# Patient Record
Sex: Male | Born: 1935 | Race: Black or African American | Hispanic: No | Marital: Single | Smoking: Former smoker
Health system: Southern US, Community
[De-identification: ages and names within clinical notes are randomized; demographics above are authoritative.]

---

## 1990-07-02 DIAGNOSIS — I10 Essential (primary) hypertension: Secondary | ICD-10-CM | POA: Insufficient documentation

## 2009-04-29 ENCOUNTER — Inpatient Hospital Stay: Payer: Self-pay | Admitting: Internal Medicine

## 2010-02-27 DIAGNOSIS — I6789 Other cerebrovascular disease: Secondary | ICD-10-CM | POA: Insufficient documentation

## 2010-11-07 IMAGING — CT CT ANGIOGRAPHY NECK
1 of 4 series · 12 of 33 positions shown · IV contrast (APPLIED)
Comparison: none

REASON FOR EXAM: abnormal carotid doppler
COMMENTS:

PROCEDURE:     CT  - CT ANGIOGRAPHY NECK W/WO  - May 02, 2009  [DATE]
RESULT:     Comparisons: No comparison
INDICATION: Abnormal carotid Doppler dated 04/30/2009
TECHNIQUE: 100 ml of Isovue 370 were administered and the extracranial
carotid arteries bilaterally were scanned during the arterial phase from the
level of the superior portion of the frontal sinuses to the proximal neck.
These images were then transferred to the Siemens work station and were
subsequently reviewed utilizing 3-D reconstructions and MIP images.

[Series 4: soft tissue · axial · 0.39mm/px · z∈[+94,+370]mm · 12 of 110 slices shown]
[im 9/110  soft-tissue]
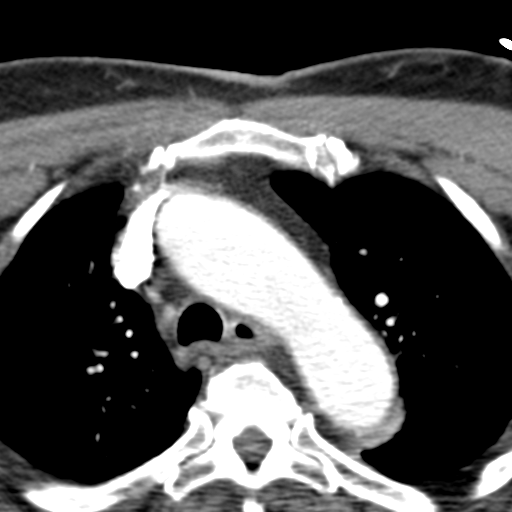
[im 17/110  bone]
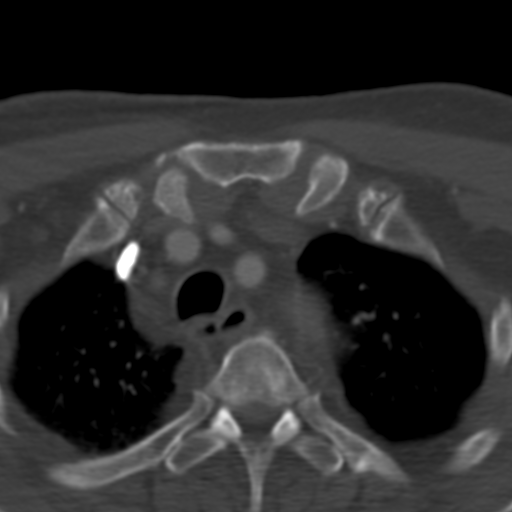
[im 26/110  soft-tissue]
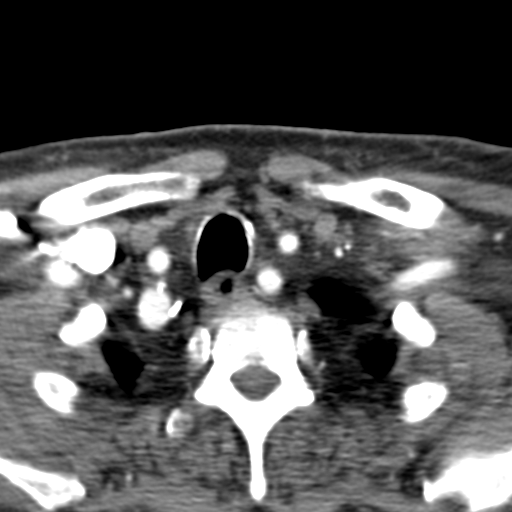
[im 34/110  bone]
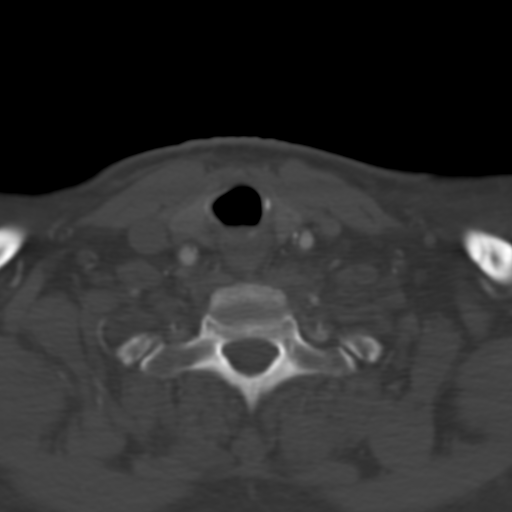
[im 42/110  soft-tissue]
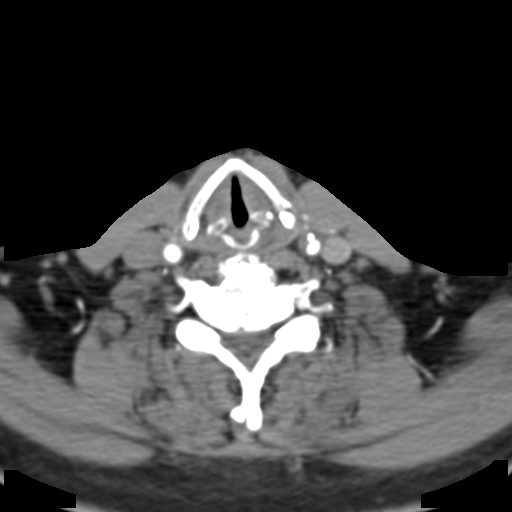
[im 51/110  bone]
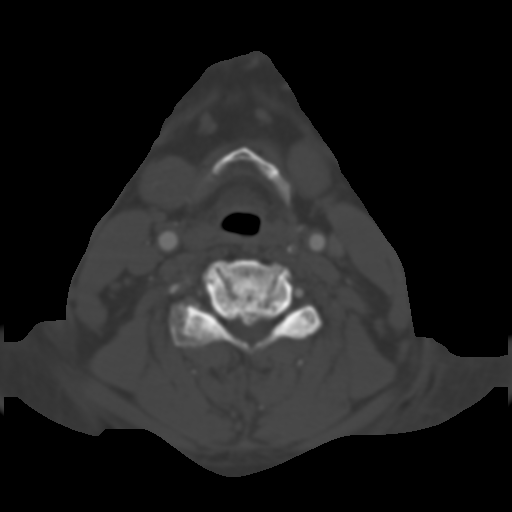
[im 59/110  soft-tissue]
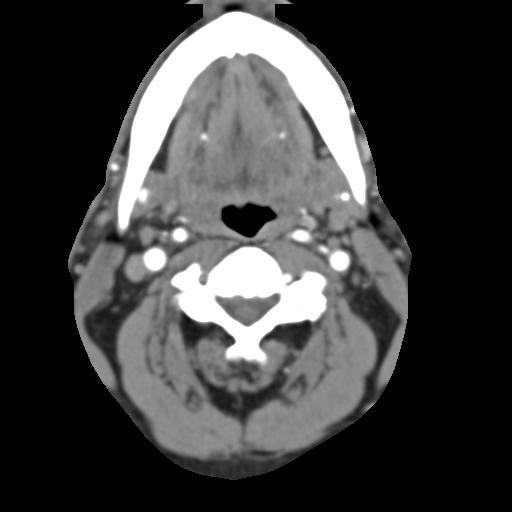
[im 68/110  bone]
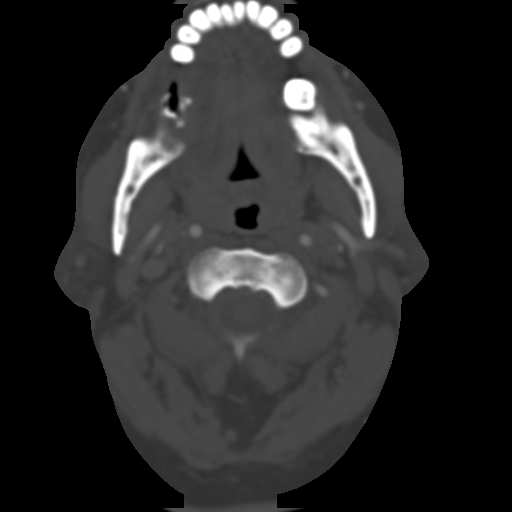
[im 76/110  soft-tissue]
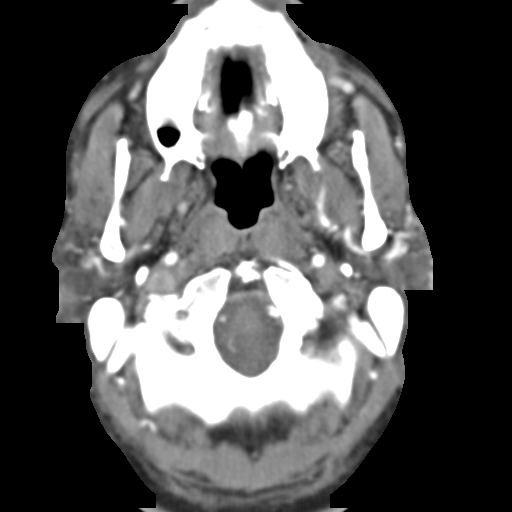
[im 84/110  bone]
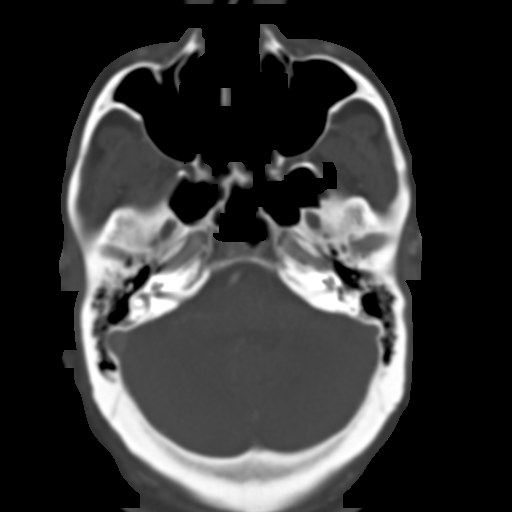
[im 93/110  soft-tissue]
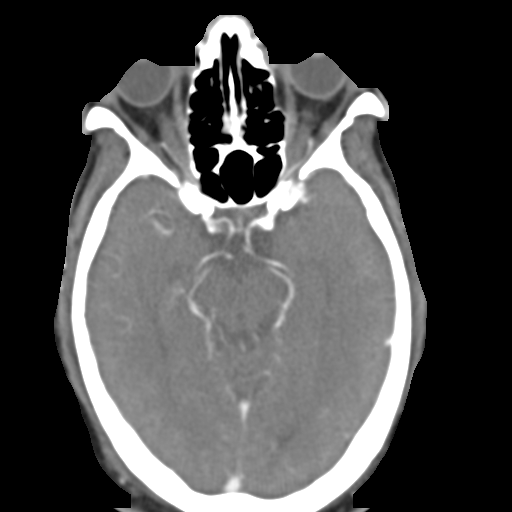
[im 101/110  bone]
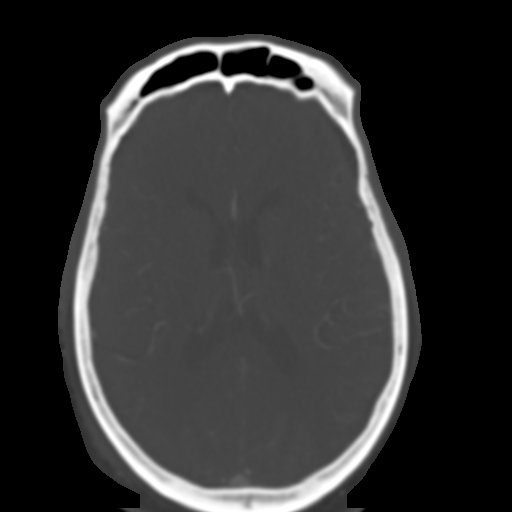

[12 of 33 positions shown; findings below may reference images not displayed]

FINDINGS: A three-vessel configuration of the aortic arch is identified with normal
ostium. The vertebral arteries are identified arising from the subclavian
arteries and entering the transverse foramen bilaterally at C6.

The carotid arteries are identified and are symmetric and unremarkable.
There is no evidence of aneurysm or carotid dissection bilaterally.

Right carotid artery: There is no significant atherosclerotic plaque within
the right common carotid artery. There is a complex, heavily calcified
atherosclerotic plaque within the right carotid bulb. There is approximately
50% stenosis of the right carotid bulb/proximal right internal carotid
artery, but evaluation slightly limited secondary to the blooming artifact
resulting from the dense calcification. The right external carotid artery is
patent.

Left carotid artery: There is complex atherosclerotic plaque within the left
common carotid artery with approximately 40% stenosis involving
approximately 5 cm of the mid left common carotid artery. There is a complex
atherosclerotic plaque within the left carotid bulb. There is less than 60 %
stenosis of the left carotid bulb/proximal right internal carotid artery,
but evaluation slightly limited secondary to the blooming artifact resulting
from the dense calcification. The left external carotid artery is patent.

The visualized portions of the brain are unremarkable.
IMPRESSION: 1. There is approximately 50% stenosis of the right carotid bulb/proximal
right internal carotid artery, but evaluation slightly limited secondary to
the blooming artifact resulting from the dense calcification.

2.  There is less than 60 % stenosis of the left carotid bulb/proximal right
internal carotid artery, but evaluation slightly limited secondary to the
blooming artifact resulting from the dense calcification.

3.  There is a complex atherosclerotic plaque within the left common carotid
artery with approximately 40% stenosis.

## 2011-11-21 DIAGNOSIS — E7800 Pure hypercholesterolemia, unspecified: Secondary | ICD-10-CM | POA: Insufficient documentation

## 2012-03-19 ENCOUNTER — Ambulatory Visit: Payer: Self-pay | Admitting: Ophthalmology

## 2012-03-31 ENCOUNTER — Ambulatory Visit: Payer: Self-pay | Admitting: Ophthalmology

## 2012-10-22 DIAGNOSIS — N189 Chronic kidney disease, unspecified: Secondary | ICD-10-CM | POA: Insufficient documentation

## 2012-10-22 DIAGNOSIS — G40909 Epilepsy, unspecified, not intractable, without status epilepticus: Secondary | ICD-10-CM | POA: Insufficient documentation

## 2012-10-22 DIAGNOSIS — I2510 Atherosclerotic heart disease of native coronary artery without angina pectoris: Secondary | ICD-10-CM | POA: Insufficient documentation

## 2013-03-18 ENCOUNTER — Ambulatory Visit: Payer: Self-pay | Admitting: Ophthalmology

## 2013-04-21 ENCOUNTER — Ambulatory Visit: Payer: Self-pay | Admitting: Ophthalmology

## 2014-05-27 DIAGNOSIS — I6529 Occlusion and stenosis of unspecified carotid artery: Secondary | ICD-10-CM | POA: Insufficient documentation

## 2015-02-08 DIAGNOSIS — M1A0610 Idiopathic chronic gout, right knee, without tophus (tophi): Secondary | ICD-10-CM | POA: Insufficient documentation

## 2015-03-13 NOTE — Op Note (Signed)
PATIENT NAME:  Spencer Gibson, [name] MR#:  [id number] DATE OF BIRTH:  [birth year]  DATE OF PROCEDURE:  03/31/2012  PREOPERATIVE DIAGNOSIS: Visually significant cataract of the left eye.   POSTOPERATIVE DIAGNOSIS: Visually significant cataract of the left eye.   OPERATIVE PROCEDURE: Cataract extraction by phacoemulsification with implant of intraocular lens to left eye.   SURGEON: Galen ManilaWilliam Cayde Held, MD.   ANESTHESIA:  1. Managed anesthesia care.  2. Topical tetracaine drops followed by 2% Xylocaine jelly applied in the preoperative holding area.   COMPLICATIONS: None.   TECHNIQUE:  Stop-and-chop    DESCRIPTION OF PROCEDURE: The patient was examined and consented in the preoperative holding area where the aforementioned topical anesthesia was applied to the left eye and then brought back to the Operating Room where the left eye was prepped and draped in the usual sterile ophthalmic fashion and a lid speculum was placed. A paracentesis was created with the side port blade and the anterior chamber was filled with viscoelastic. A near clear corneal incision was performed with the steel keratome. A continuous curvilinear capsulorrhexis was performed with a cystotome followed by the capsulorrhexis forceps. Hydrodissection and hydrodelineation were carried out with BSS on a blunt cannula. The lens was removed in a stop-and-chop technique and the remaining cortical material was removed with the irrigation-aspiration handpiece. The capsular bag was inflated with viscoelastic and the Technus ZCB00 19.0-diopter lens, serial number [id number] was placed in the capsular bag without complication. The remaining viscoelastic was removed from the eye with the irrigation-aspiration handpiece. The wounds were hydrated. The anterior chamber was flushed with Miostat and the eye was inflated to physiologic pressure. The wounds were found to be water tight. The eye was dressed with Vigamox. The patient was given protective  glasses to wear throughout the day and a shield with which to sleep tonight. The patient was also given drops with which to begin a drop regimen today and will follow-up with me in one day.   ____________________________ Jerilee FieldWilliam L. Elody Kleinsasser, MD wlp:drc D: 03/31/2012 10:47:00 ET T: 03/31/2012 11:08:31 ET JOB#: [id number]  cc: Jabir Dahlem L. Robbie Nangle, MD, <Dictator> Jerilee FieldWILLIAM L Katiejo Gilroy MD ELECTRONICALLY SIGNED 04/09/2012 13:37

## 2015-05-31 DIAGNOSIS — J069 Acute upper respiratory infection, unspecified: Secondary | ICD-10-CM | POA: Insufficient documentation

## 2016-12-24 ENCOUNTER — Encounter: Payer: Self-pay | Admitting: Podiatry

## 2016-12-24 ENCOUNTER — Ambulatory Visit (INDEPENDENT_AMBULATORY_CARE_PROVIDER_SITE_OTHER): Payer: Medicare Other | Admitting: Podiatry

## 2016-12-24 DIAGNOSIS — M2010 Hallux valgus (acquired), unspecified foot: Secondary | ICD-10-CM

## 2016-12-24 DIAGNOSIS — M79676 Pain in unspecified toe(s): Secondary | ICD-10-CM

## 2016-12-24 DIAGNOSIS — B351 Tinea unguium: Secondary | ICD-10-CM

## 2016-12-24 NOTE — Progress Notes (Signed)
   Subjective:    Patient ID: Spencer Gibson, male    DOB: 08/18/1934, 83 y.o.   MRN: [id number]  HPI this patient presents the office with chief complaint painful nails on both feet. He states the nails have become thick and long and he is unable to self treat. He also has severe bunions on both feet. He presents the office today for definitive evaluation and treatment of these long painful nails    Review of Systems  All other systems reviewed and are negative.      Objective:   Physical Exam  GENERAL APPEARANCE: Alert, conversant. Appropriately groomed. No acute distress.  VASCULAR: Pedal pulses are  palpable at  [facility] and PT bilateral.  Capillary refill time is immediate to all digits,  Normal temperature gradient.  Digital hair growth is present bilateral  NEUROLOGIC: sensation is normal to 5.07 monofilament at 5/5 sites bilateral.  Light touch is intact bilateral, Muscle strength normal.  MUSCULOSKELETAL: acceptable muscle strength, tone and stability bilateral.  Intrinsic muscluature intact bilateral.  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  No preulcerative lesions or ulcers  are seen, no interdigital maceration noted.  No open lesions present.  . No drainage noted.  NAILS  Thick disfigured discolored nails both feet        Assessment & Plan:  Onychomycosis  B/L  IE  Debride nails  B/L   Spencer Gibson DPM

## 2017-03-28 ENCOUNTER — Ambulatory Visit: Payer: Self-pay | Admitting: Podiatry

## 2017-12-23 ENCOUNTER — Ambulatory Visit (INDEPENDENT_AMBULATORY_CARE_PROVIDER_SITE_OTHER): Payer: 59 | Admitting: Podiatry

## 2017-12-23 ENCOUNTER — Encounter: Payer: Self-pay | Admitting: Podiatry

## 2017-12-23 DIAGNOSIS — M79676 Pain in unspecified toe(s): Secondary | ICD-10-CM

## 2017-12-23 DIAGNOSIS — B351 Tinea unguium: Secondary | ICD-10-CM

## 2017-12-23 DIAGNOSIS — D689 Coagulation defect, unspecified: Secondary | ICD-10-CM

## 2017-12-23 DIAGNOSIS — M2010 Hallux valgus (acquired), unspecified foot: Secondary | ICD-10-CM

## 2017-12-23 NOTE — Progress Notes (Signed)
Complaint:  Visit Type: Patient returns to my office for continued preventative foot care services. Complaint: Patient states" my nails have grown long and thick and become painful to walk and wear shoes" . The patient presents for preventative foot care services. No changes to ROS.  Patient has history of athletes foot between toes which he was given medicine by his medical doctor.  Podiatric Exam: Vascular: dorsalis pedis and posterior tibial pulses are palpable bilateral. Capillary return is immediate. Temperature gradient is WNL. Skin turgor WNL  Sensorium: Normal Semmes Weinstein monofilament test. Normal tactile sensation bilaterally. Nail Exam: Pt has thick disfigured discolored nails with subungual debris noted bilateral entire nail hallux through fifth toenails Ulcer Exam: There is no evidence of ulcer or pre-ulcerative changes or infection. Orthopedic Exam: Muscle tone and strength are WNL. No limitations in general ROM. No crepitus or effusions noted. HAV with HT B/L Skin: No Porokeratosis. No infection or ulcers.  Healing interdigital spaces except 4th right.  Diagnosis:  Onychomycosis, , Pain in right toe, pain in left toes  Treatment & Plan Procedures and Treatment: Consent by patient was obtained for treatment procedures.   Debridement of mycotic and hypertrophic toenails, 1 through 5 bilateral and clearing of subungual debris. No ulceration, no infection noted. Told to continue applying medicine. Return Visit-Office Procedure: Patient instructed to return to the office for a follow up visit 3 months for continued evaluation and treatment.    Helane GuntherGregory Levante Simones DPM

## 2018-03-24 ENCOUNTER — Encounter: Payer: Self-pay | Admitting: Podiatry

## 2018-03-24 ENCOUNTER — Ambulatory Visit (INDEPENDENT_AMBULATORY_CARE_PROVIDER_SITE_OTHER): Payer: 59 | Admitting: Podiatry

## 2018-03-24 DIAGNOSIS — D689 Coagulation defect, unspecified: Secondary | ICD-10-CM

## 2018-03-24 DIAGNOSIS — M79676 Pain in unspecified toe(s): Secondary | ICD-10-CM

## 2018-03-24 DIAGNOSIS — B351 Tinea unguium: Secondary | ICD-10-CM

## 2018-03-24 DIAGNOSIS — M2010 Hallux valgus (acquired), unspecified foot: Secondary | ICD-10-CM

## 2018-03-24 NOTE — Progress Notes (Signed)
Complaint:  Visit Type: Patient returns to my office for continued preventative foot care services. Complaint: Patient states" my nails have grown long and thick and become painful to walk and wear shoes" . The patient presents for preventative foot care services. No changes to ROS.  Patient is taking plavix.  Podiatric Exam: Vascular: dorsalis pedis and posterior tibial pulses are palpable bilateral. Capillary return is immediate. Temperature gradient is WNL. Skin turgor WNL  Sensorium: Normal Semmes Weinstein monofilament test. Normal tactile sensation bilaterally. Nail Exam: Pt has thick disfigured discolored nails with subungual debris noted bilateral entire nail hallux through fifth toenails Ulcer Exam: There is no evidence of ulcer or pre-ulcerative changes or infection. Orthopedic Exam: Muscle tone and strength are WNL. No limitations in general ROM. No crepitus or effusions noted. HAV with HT B/L Skin: No Porokeratosis. No infection or ulcers.  Healing interdigital spaces except 4th right.  Diagnosis:  Onychomycosis, , Pain in right toe, pain in left toes  Treatment & Plan Procedures and Treatment: Consent by patient was obtained for treatment procedures.   Debridement of mycotic and hypertrophic toenails, 1 through 5 bilateral and clearing of subungual debris. No ulceration, no infection noted.  Return Visit-Office Procedure: Patient instructed to return to the office for a follow up visit 3 months for continued evaluation and treatment.    [name] DPM

## 2018-06-26 ENCOUNTER — Encounter: Payer: Self-pay | Admitting: Podiatry

## 2018-06-26 ENCOUNTER — Ambulatory Visit (INDEPENDENT_AMBULATORY_CARE_PROVIDER_SITE_OTHER): Payer: 59 | Admitting: Podiatry

## 2018-06-26 DIAGNOSIS — B351 Tinea unguium: Secondary | ICD-10-CM

## 2018-06-26 DIAGNOSIS — M2010 Hallux valgus (acquired), unspecified foot: Secondary | ICD-10-CM | POA: Diagnosis not present

## 2018-06-26 DIAGNOSIS — M79676 Pain in unspecified toe(s): Secondary | ICD-10-CM | POA: Diagnosis not present

## 2018-06-26 DIAGNOSIS — D689 Coagulation defect, unspecified: Secondary | ICD-10-CM

## 2018-06-26 NOTE — Progress Notes (Signed)
Complaint:  Visit Type: Patient returns to my office for continued preventative foot care services. Complaint: Patient states" my nails have grown long and thick and become painful to walk and wear shoes" . The patient presents for preventative foot care services. No changes to ROS.  Patient is taking plavix.  Podiatric Exam: Vascular: dorsalis pedis and posterior tibial pulses are palpable bilateral. Capillary return is immediate. Temperature gradient is WNL. Skin turgor WNL  Sensorium: Normal Semmes Weinstein monofilament test. Normal tactile sensation bilaterally. Nail Exam: Pt has thick disfigured discolored nails with subungual debris noted bilateral entire nail hallux through fifth toenails Ulcer Exam: There is no evidence of ulcer or pre-ulcerative changes or infection. Orthopedic Exam: Muscle tone and strength are WNL. No limitations in general ROM. No crepitus or effusions noted. HAV with HT B/L Skin: No Porokeratosis. No infection or ulcers.    Diagnosis:  Onychomycosis, , Pain in right toe, pain in left toes  Treatment & Plan Procedures and Treatment: Consent by patient was obtained for treatment procedures.   Debridement of mycotic and hypertrophic toenails, 1 through 5 bilateral and clearing of subungual debris. No ulceration, no infection noted.  Return Visit-Office Procedure: Patient instructed to return to the office for a follow up visit 3 months for continued evaluation and treatment.    Reyanne Hussar DPM 

## 2018-09-29 ENCOUNTER — Ambulatory Visit: Payer: 59 | Admitting: Podiatry

## 2018-10-13 ENCOUNTER — Ambulatory Visit (INDEPENDENT_AMBULATORY_CARE_PROVIDER_SITE_OTHER): Payer: Medicare Other | Admitting: Podiatry

## 2018-10-13 ENCOUNTER — Encounter: Payer: Self-pay | Admitting: Podiatry

## 2018-10-13 DIAGNOSIS — M2010 Hallux valgus (acquired), unspecified foot: Secondary | ICD-10-CM

## 2018-10-13 DIAGNOSIS — M79676 Pain in unspecified toe(s): Secondary | ICD-10-CM | POA: Diagnosis not present

## 2018-10-13 DIAGNOSIS — B351 Tinea unguium: Secondary | ICD-10-CM

## 2018-10-13 DIAGNOSIS — D689 Coagulation defect, unspecified: Secondary | ICD-10-CM | POA: Diagnosis not present

## 2018-10-13 NOTE — Progress Notes (Signed)
Complaint:  Visit Type: Patient returns to my office for continued preventative foot care services. Complaint: Patient states" my nails have grown long and thick and become painful to walk and wear shoes" . The patient presents for preventative foot care services. No changes to ROS.  Patient is taking plavix.  Podiatric Exam: Vascular: dorsalis pedis and posterior tibial pulses are palpable bilateral. Capillary return is immediate. Temperature gradient is WNL. Skin turgor WNL  Sensorium: Normal Semmes Weinstein monofilament test. Normal tactile sensation bilaterally. Nail Exam: Pt has thick disfigured discolored nails with subungual debris noted bilateral entire nail hallux through fifth toenails Ulcer Exam: There is no evidence of ulcer or pre-ulcerative changes or infection. Orthopedic Exam: Muscle tone and strength are WNL. No limitations in general ROM. No crepitus or effusions noted. HAV with HT B/L Skin: No Porokeratosis. No infection or ulcers.    Diagnosis:  Onychomycosis, , Pain in right toe, pain in left toes  Treatment & Plan Procedures and Treatment: Consent by patient was obtained for treatment procedures.   Debridement of mycotic and hypertrophic toenails, 1 through 5 bilateral and clearing of subungual debris. No ulceration, no infection noted.  Return Visit-Office Procedure: Patient instructed to return to the office for a follow up visit 3 months for continued evaluation and treatment.    Helane GuntherGregory Pebbles Zeiders DPM

## 2019-01-12 ENCOUNTER — Encounter: Payer: Self-pay | Admitting: Podiatry

## 2019-01-12 ENCOUNTER — Ambulatory Visit (INDEPENDENT_AMBULATORY_CARE_PROVIDER_SITE_OTHER): Payer: Medicare Other | Admitting: Podiatry

## 2019-01-12 DIAGNOSIS — M79676 Pain in unspecified toe(s): Secondary | ICD-10-CM | POA: Diagnosis not present

## 2019-01-12 DIAGNOSIS — M2010 Hallux valgus (acquired), unspecified foot: Secondary | ICD-10-CM | POA: Diagnosis not present

## 2019-01-12 DIAGNOSIS — B351 Tinea unguium: Secondary | ICD-10-CM | POA: Diagnosis not present

## 2019-01-12 DIAGNOSIS — D689 Coagulation defect, unspecified: Secondary | ICD-10-CM | POA: Diagnosis not present

## 2019-01-12 NOTE — Progress Notes (Signed)
Complaint:  Visit Type: Patient returns to my office for continued preventative foot care services. Complaint: Patient states" my nails have grown long and thick and become painful to walk and wear shoes" . The patient presents for preventative foot care services. No changes to ROS.  Patient is taking plavix.  Podiatric Exam: Vascular: dorsalis pedis and posterior tibial pulses are palpable bilateral. Capillary return is immediate. Temperature gradient is WNL. Skin turgor WNL  Sensorium: Normal Semmes Weinstein monofilament test. Normal tactile sensation bilaterally. Nail Exam: Pt has thick disfigured discolored nails with subungual debris noted bilateral entire nail hallux through fifth toenails Ulcer Exam: There is no evidence of ulcer or pre-ulcerative changes or infection. Orthopedic Exam: Muscle tone and strength are WNL. No limitations in general ROM. No crepitus or effusions noted. HAV with HT B/L Skin: No Porokeratosis. No infection or ulcers.    Diagnosis:  Onychomycosis, , Pain in right toe, pain in left toes  Treatment & Plan Procedures and Treatment: Consent by patient was obtained for treatment procedures.   Debridement of mycotic and hypertrophic toenails, 1 through 5 bilateral and clearing of subungual debris. No ulceration, no infection noted.  Return Visit-Office Procedure: Patient instructed to return to the office for a follow up visit 10 weeks  for continued evaluation and treatment.    [name] DPM

## 2019-04-09 ENCOUNTER — Ambulatory Visit: Payer: Medicare Other | Admitting: Podiatry

## 2019-04-09 ENCOUNTER — Other Ambulatory Visit: Payer: Medicare Other

## 2019-04-09 ENCOUNTER — Other Ambulatory Visit: Payer: Self-pay

## 2019-04-09 ENCOUNTER — Ambulatory Visit (INDEPENDENT_AMBULATORY_CARE_PROVIDER_SITE_OTHER): Payer: Medicare Other | Admitting: Podiatry

## 2019-04-09 ENCOUNTER — Encounter: Payer: Self-pay | Admitting: Podiatry

## 2019-04-09 VITALS — Temp 96.6°F

## 2019-04-09 DIAGNOSIS — L309 Dermatitis, unspecified: Secondary | ICD-10-CM | POA: Diagnosis not present

## 2019-04-09 DIAGNOSIS — B351 Tinea unguium: Secondary | ICD-10-CM | POA: Diagnosis not present

## 2019-04-09 DIAGNOSIS — M79676 Pain in unspecified toe(s): Secondary | ICD-10-CM | POA: Diagnosis not present

## 2019-04-09 DIAGNOSIS — D689 Coagulation defect, unspecified: Secondary | ICD-10-CM | POA: Diagnosis not present

## 2019-04-09 DIAGNOSIS — M2010 Hallux valgus (acquired), unspecified foot: Secondary | ICD-10-CM

## 2019-04-09 NOTE — Progress Notes (Signed)
This patient presents to the office with chief complaint of a left swollen irritated left foot.  He presents to the office with a male which may be his daughter.  He says that this past Saturday his left foot became red swollen and painful and he was seen at [facility] for an evaluation and treatment.  I have no paperwork from his daughter and there is no information in the computer since [place]  is not affiliated with [facility].  He was diagnosed as having green  fungus toes and was treated with Lamisil and clindamycin.  He was told to soak his left foot in warm water and in clorox. Based on her explantion it sounded that he developed a cellulitis because of  interdigital masceration. The daughter said that his left foot became  Increasingly  Swollen and painful  and he had difficulty walking so she went to his medical doctor.Marland Kitchen He was prescribed hydrocodone.  She presents the office today stating that the swelling is down and the pain has lessened and there is no drainage from the top of the foot.  She is concerned today  about  the appearance on  the top of his left foot.  Overall she says she has improved but she is concerned with the top of his left foot.  My examination of the foot reveals a skin reactivity has occurred in the absence of any infection or streaking.  The toes have minimal swelling today.  During this visit he is also concerned might patient which I provide preventative foot care services.  He says his nails have grown long and thick and are painful walking and wearing his shoes.  General Appearance  Alert, conversant and in no acute stress.  Vascular  Dorsalis pedis and posterior tibial  pulses are weakly  palpable  bilaterally.  Capillary return is within normal limits  bilaterally. Temperature is within normal limits  Right foot.  Increased temperature left foot.  Swelling  B/L.  Neurologic  Senn-Weinstein monofilament wire test within normal limits  bilaterally. Muscle power within  normal limits bilaterally.  Nails Thick disfigured discolored nails with subungual debris  from hallux to fifth toes bilaterally. No evidence of bacterial infection or drainage bilaterally.  Orthopedic  No limitations of motion  feet .  No crepitus or effusions noted.  No bony pathology or digital deformities noted.  Skin  normotropic skin with no porokeratosis right foot.  No evidence of streaking or drainage.  There is hot swollen red/purplish discoloration on the dorsum of left foot.  See pictures.        Onychomycosis  B/L  Dermatitis left foot.  ROV.  Debridement of his nails x10.  Examined his left foot and noted that there is red discoloration noted through dark areas of skin on the dorsum of the left foot.  There is no drainage today  and no evidence of any open wounds on the dorsum of the left foot.  There is no evidence of any streaking and no severe pain on the dorsum of the foot.  I feel based on my evaluation that he has some type of skin reactivity.  This could include reaction to the medication or reaction to the Clorox or even hot water which could have been utilized to soak his left foot.  After discussing this condition with this patient I told them to continue the clindamycin and discontinue the Lamisil.  Told him to stop soaking the left foot in Clorox and water.  Patient was told to soak his left foot and soapy sudsy water.  Patient was told to monitor his foot and to return to the office if the foot worsens.  RTC 3 months for preventative foot care services.  Helane GuntherGregory Madalyne Husk DPM

## 2019-04-10 MED ORDER — SILVER SULFADIAZINE 1 % EX CREA: 1.0000 "application " | TOPICAL_CREAM | Freq: Every day | CUTANEOUS | 1 refills | Status: DC

## 2019-04-10 NOTE — Addendum Note (Signed)
Addended by: Geraldine Contras D on: 04/10/2019 01:46 PM   Modules accepted: Orders

## 2019-04-20 ENCOUNTER — Other Ambulatory Visit: Payer: Medicare Other

## 2019-04-23 ENCOUNTER — Encounter: Payer: Self-pay | Admitting: Podiatry

## 2019-04-23 ENCOUNTER — Ambulatory Visit: Payer: Medicare Other | Admitting: Podiatry

## 2019-04-23 ENCOUNTER — Ambulatory Visit (INDEPENDENT_AMBULATORY_CARE_PROVIDER_SITE_OTHER): Payer: Medicare Other | Admitting: Podiatry

## 2019-04-23 ENCOUNTER — Other Ambulatory Visit: Payer: Self-pay

## 2019-04-23 DIAGNOSIS — L309 Dermatitis, unspecified: Secondary | ICD-10-CM

## 2019-04-23 DIAGNOSIS — B351 Tinea unguium: Secondary | ICD-10-CM

## 2019-04-23 DIAGNOSIS — M79676 Pain in unspecified toe(s): Secondary | ICD-10-CM | POA: Diagnosis not present

## 2019-04-23 DIAGNOSIS — D689 Coagulation defect, unspecified: Secondary | ICD-10-CM
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
## 2019-05-14 ENCOUNTER — Ambulatory Visit: Payer: Medicare Other | Admitting: Podiatry

## 2019-07-09 ENCOUNTER — Ambulatory Visit: Payer: Medicare Other | Admitting: Podiatry

## 2019-07-23 ENCOUNTER — Ambulatory Visit: Payer: Medicare Other | Admitting: Podiatry

## 2019-07-24 DIAGNOSIS — K5909 Other constipation: Secondary | ICD-10-CM | POA: Insufficient documentation

## 2019-10-13 DIAGNOSIS — K353 Acute appendicitis with localized peritonitis, without perforation or gangrene: Secondary | ICD-10-CM | POA: Insufficient documentation

## 2020-01-04 ENCOUNTER — Other Ambulatory Visit: Payer: Self-pay

## 2020-01-04 ENCOUNTER — Encounter: Payer: Self-pay | Admitting: Podiatry

## 2020-01-04 ENCOUNTER — Ambulatory Visit (INDEPENDENT_AMBULATORY_CARE_PROVIDER_SITE_OTHER): Payer: Medicare Other | Admitting: Podiatry

## 2020-01-04 DIAGNOSIS — D689 Coagulation defect, unspecified: Secondary | ICD-10-CM | POA: Diagnosis not present

## 2020-01-04 DIAGNOSIS — M79676 Pain in unspecified toe(s): Secondary | ICD-10-CM

## 2020-01-04 DIAGNOSIS — B351 Tinea unguium: Secondary | ICD-10-CM

## 2020-01-04 NOTE — Progress Notes (Signed)
Complaint:  Visit Type: Patient returns to my office for continued preventative foot care services. Complaint: Patient states" my nails have grown long and thick and become painful to walk and wear shoes" Patient has been taking plavix.. The patient presents for preventative foot care services.  Podiatric Exam: Vascular: dorsalis pedis and posterior tibial pulses are weakly  palpable bilateral. Capillary return is immediate. Temperature gradient is WNL. Skin turgor WNL  Sensorium: Normal Semmes Weinstein monofilament test. Normal tactile sensation bilaterally. Nail Exam: Pt has thick disfigured discolored nails with subungual debris noted bilateral entire nail hallux through fifth toenails Ulcer Exam: There is no evidence of ulcer or pre-ulcerative changes or infection. Orthopedic Exam: Muscle tone and strength are WNL. No limitations in general ROM. No crepitus or effusions noted. Foot type and digits show no abnormalities. Bony prominences are unremarkable. Skin: No Porokeratosis. No infection or ulcers  Diagnosis:  Onychomycosis, , Pain in right toe, pain in left toes  Treatment & Plan Procedures and Treatment: Consent by patient was obtained for treatment procedures.   Debridement of mycotic and hypertrophic toenails, 1 through 5 bilateral and clearing of subungual debris. No ulceration, no infection noted.  Return Visit-Office Procedure: Patient instructed to return to the office for a follow up visit 3 months for continued evaluation and treatment.    [name] DPM

## 2020-02-05 DIAGNOSIS — N400 Enlarged prostate without lower urinary tract symptoms: Secondary | ICD-10-CM | POA: Insufficient documentation

## 2020-02-15 DIAGNOSIS — K56609 Unspecified intestinal obstruction, unspecified as to partial versus complete obstruction: Secondary | ICD-10-CM | POA: Insufficient documentation

## 2020-02-19 DIAGNOSIS — E663 Overweight: Secondary | ICD-10-CM | POA: Insufficient documentation

## 2020-03-07 DIAGNOSIS — R339 Retention of urine, unspecified: Secondary | ICD-10-CM | POA: Insufficient documentation

## 2020-04-07 ENCOUNTER — Ambulatory Visit (INDEPENDENT_AMBULATORY_CARE_PROVIDER_SITE_OTHER): Payer: Medicare Other | Admitting: Podiatry

## 2020-04-07 ENCOUNTER — Encounter: Payer: Self-pay | Admitting: Podiatry

## 2020-04-07 ENCOUNTER — Other Ambulatory Visit: Payer: Self-pay

## 2020-04-07 DIAGNOSIS — B351 Tinea unguium: Secondary | ICD-10-CM | POA: Diagnosis not present

## 2020-04-07 DIAGNOSIS — M79676 Pain in unspecified toe(s): Secondary | ICD-10-CM | POA: Diagnosis not present

## 2020-04-07 DIAGNOSIS — D689 Coagulation defect, unspecified: Secondary | ICD-10-CM

## 2020-04-07 DIAGNOSIS — N189 Chronic kidney disease, unspecified: Secondary | ICD-10-CM

## 2020-04-07 NOTE — Progress Notes (Signed)
This patient returns to my office for at risk foot care.  This patient requires this care by a professional since this patient will be at risk due to having coagulation defect and chronic kidney disease.  Patient is taking plavix.  This patient is unable to cut nails himself since the patient cannot reach his nails.These nails are painful walking and wearing shoes.  This patient presents for at risk foot care today.  General Appearance  Alert, conversant and in no acute stress.  Vascular  Dorsalis pedis and posterior tibial  pulses are weakly  palpable  bilaterally.  Capillary return is within normal limits  bilaterally. Temperature is within normal limits  bilaterally.  Neurologic  Senn-Weinstein monofilament wire test within normal limits  bilaterally. Muscle power within normal limits bilaterally.  Nails Thick disfigured discolored nails with subungual debris  from hallux to fifth toes bilaterally. No evidence of bacterial infection or drainage bilaterally.  Orthopedic  No limitations of motion  feet .  No crepitus or effusions noted.  No bony pathology or digital deformities noted.  Skin  normotropic skin with no porokeratosis noted bilaterally.  No signs of infections or ulcers noted.     Onychomycosis  Pain in right toes  Pain in left toes  Consent was obtained for treatment procedures.   Mechanical debridement of nails 1-5  bilaterally performed with a nail nipper.  Filed with dremel without incident.    Return office visit    3 months                  Told patient to return for periodic foot care and evaluation due to potential at risk complications.   Kizzy Olafson DPM  

## 2020-07-11 ENCOUNTER — Ambulatory Visit: Payer: Medicare Other | Admitting: Podiatry

## 2020-08-01 ENCOUNTER — Ambulatory Visit (INDEPENDENT_AMBULATORY_CARE_PROVIDER_SITE_OTHER): Payer: Medicare Other | Admitting: Podiatry

## 2020-08-01 ENCOUNTER — Other Ambulatory Visit: Payer: Self-pay

## 2020-08-01 ENCOUNTER — Encounter: Payer: Self-pay | Admitting: Podiatry

## 2020-08-01 DIAGNOSIS — M79676 Pain in unspecified toe(s): Secondary | ICD-10-CM | POA: Diagnosis not present

## 2020-08-01 DIAGNOSIS — B351 Tinea unguium: Secondary | ICD-10-CM

## 2020-08-01 DIAGNOSIS — N189 Chronic kidney disease, unspecified: Secondary | ICD-10-CM

## 2020-08-01 DIAGNOSIS — M109 Gout, unspecified: Secondary | ICD-10-CM | POA: Insufficient documentation

## 2020-08-01 DIAGNOSIS — D689 Coagulation defect, unspecified: Secondary | ICD-10-CM

## 2020-08-01 NOTE — Progress Notes (Signed)
This patient returns to my office for at risk foot care.  This patient requires this care by a professional since this patient will be at risk due to having coagulation defect and chronic kidney disease.  Patient is taking plavix.  This patient is unable to cut nails himself since the patient cannot reach his nails.These nails are painful walking and wearing shoes.  This patient presents for at risk foot care today.  General Appearance  Alert, conversant and in no acute stress.  Vascular  Dorsalis pedis and posterior tibial  pulses are weakly  palpable  bilaterally.  Capillary return is within normal limits  bilaterally. Temperature is within normal limits  bilaterally.  Neurologic  Senn-Weinstein monofilament wire test within normal limits  bilaterally. Muscle power within normal limits bilaterally.  Nails Thick disfigured discolored nails with subungual debris  from hallux to fifth toes bilaterally. No evidence of bacterial infection or drainage bilaterally.  Orthopedic  No limitations of motion  feet .  No crepitus or effusions noted.  No bony pathology or digital deformities noted.  Skin  normotropic skin with no porokeratosis noted bilaterally.  No signs of infections or ulcers noted.     Onychomycosis  Pain in right toes  Pain in left toes  Consent was obtained for treatment procedures.   Mechanical debridement of nails 1-5  bilaterally performed with a nail nipper.  Filed with dremel without incident.    Return office visit    3 months                  Told patient to return for periodic foot care and evaluation due to potential at risk complications.   [name] DPM

## 2020-08-03 ENCOUNTER — Other Ambulatory Visit: Payer: Self-pay

## 2020-08-03 ENCOUNTER — Ambulatory Visit: Payer: Medicare Other | Attending: Internal Medicine

## 2020-08-03 DIAGNOSIS — Z23 Encounter for immunization: Secondary | ICD-10-CM

## 2020-08-03 NOTE — Progress Notes (Signed)
   Covid-19 Vaccination Clinic  Name:  Spencer Gibson    MRN: [id number] DOB: 07-Oct-1934  08/03/2020  [name] was observed post Covid-19 immunization for 15 minutes without incident. He was provided with Vaccine Information Sheet and instruction to access the V-Safe system.   [name] was instructed to call 911 with any severe reactions post vaccine: Marland Kitchen Difficulty breathing  . Swelling of face and throat  . A fast heartbeat  . A bad rash all over body  . Dizziness and weakness   Immunizations Administered    Name Date Dose VIS Date Route   Pfizer COVID-19 Vaccine 08/03/2020 12:50 PM 0.3 mL 01/13/2019 Intramuscular   Manufacturer: [employer], [employer]   Lot: [id number]   NDC: [address]-0

## 2020-08-23 ENCOUNTER — Other Ambulatory Visit: Payer: Self-pay

## 2020-08-23 ENCOUNTER — Ambulatory Visit: Payer: Medicare Other | Attending: Internal Medicine

## 2020-08-23 DIAGNOSIS — Z23 Encounter for immunization: Secondary | ICD-10-CM

## 2020-08-23 NOTE — Progress Notes (Signed)
° °  Covid-19 Vaccination Clinic  Name:  Spencer Gibson    MRN: [id number] DOB: 1933-12-22  08/23/2020  Mr. Spencer Gibson was observed post Covid-19 immunization for 15 minutes without incident. He was provided with Vaccine Information Sheet and instruction to access the V-Safe system.   Mr. Spencer Gibson was instructed to call 911 with any severe reactions post vaccine:  Difficulty breathing   Swelling of face and throat   A fast heartbeat   A bad rash all over body   Dizziness and weakness   Immunizations Administered    Name Date Dose VIS Date Route   Pfizer COVID-19 Vaccine 08/23/2020 11:05 AM 0.3 mL 01/13/2019 Intramuscular   Manufacturer: [employer]   Lot: [id number]   NDC: [id number]

## 2020-10-31 ENCOUNTER — Ambulatory Visit: Payer: Medicare Other | Admitting: Podiatry

## 2020-11-07 ENCOUNTER — Other Ambulatory Visit: Payer: Self-pay

## 2020-11-07 ENCOUNTER — Encounter: Payer: Self-pay | Admitting: Podiatry

## 2020-11-07 ENCOUNTER — Ambulatory Visit (INDEPENDENT_AMBULATORY_CARE_PROVIDER_SITE_OTHER): Payer: Medicare Other | Admitting: Podiatry

## 2020-11-07 DIAGNOSIS — N189 Chronic kidney disease, unspecified: Secondary | ICD-10-CM

## 2020-11-07 DIAGNOSIS — D689 Coagulation defect, unspecified: Secondary | ICD-10-CM

## 2020-11-07 DIAGNOSIS — L309 Dermatitis, unspecified: Secondary | ICD-10-CM

## 2020-11-07 DIAGNOSIS — B351 Tinea unguium: Secondary | ICD-10-CM

## 2020-11-07 DIAGNOSIS — M79676 Pain in unspecified toe(s): Secondary | ICD-10-CM

## 2020-11-07 NOTE — Progress Notes (Signed)
This patient returns to my office for at risk foot care.  This patient requires this care by a professional since this patient will be at risk due to having coagulation defect and chronic kidney disease.  Patient is taking plavix.  This patient is unable to cut nails himself since the patient cannot reach his nails.These nails are painful walking and wearing shoes.  This patient presents for at risk foot care today.  General Appearance  Alert, conversant and in no acute stress.  Vascular  Dorsalis pedis and posterior tibial  pulses are weakly  palpable  bilaterally.  Capillary return is within normal limits  bilaterally. Temperature is within normal limits  bilaterally.  Neurologic  Senn-Weinstein monofilament wire test within normal limits  bilaterally. Muscle power within normal limits bilaterally.  Nails Thick disfigured discolored nails with subungual debris  from hallux to fifth toes bilaterally. No evidence of bacterial infection or drainage bilaterally.  Orthopedic  No limitations of motion  feet .  No crepitus or effusions noted.  No bony pathology or digital deformities noted.  Skin  normotropic skin with no porokeratosis noted bilaterally.  No signs of infections or ulcers noted.  Masceration interdigitally  Both feet.   Onychomycosis  Pain in right toes  Pain in left toes  Consent was obtained for treatment procedures.   Mechanical debridement of nails 1-5  bilaterally performed with a nail nipper.  Filed with dremel without incident.  Patient was told to apply powder between toes both feet.   Return office visit    3 months                  Told patient to return for periodic foot care and evaluation due to potential at risk complications.   Karyna Bessler DPM  

## 2021-01-25 ENCOUNTER — Ambulatory Visit: Payer: Medicare Other

## 2021-02-13 ENCOUNTER — Other Ambulatory Visit: Payer: Self-pay

## 2021-02-13 ENCOUNTER — Ambulatory Visit (INDEPENDENT_AMBULATORY_CARE_PROVIDER_SITE_OTHER): Payer: Medicare Other | Admitting: Podiatry

## 2021-02-13 ENCOUNTER — Encounter: Payer: Self-pay | Admitting: Podiatry

## 2021-02-13 DIAGNOSIS — M79676 Pain in unspecified toe(s): Secondary | ICD-10-CM

## 2021-02-13 DIAGNOSIS — M2010 Hallux valgus (acquired), unspecified foot: Secondary | ICD-10-CM

## 2021-02-13 DIAGNOSIS — N189 Chronic kidney disease, unspecified: Secondary | ICD-10-CM

## 2021-02-13 DIAGNOSIS — D689 Coagulation defect, unspecified: Secondary | ICD-10-CM

## 2021-02-13 DIAGNOSIS — B351 Tinea unguium: Secondary | ICD-10-CM

## 2021-02-13 NOTE — Progress Notes (Signed)
This patient returns to my office for at risk foot care.  This patient requires this care by a professional since this patient will be at risk due to having coagulation defect and chronic kidney disease.  Patient is taking plavix.  This patient is unable to cut nails himself since the patient cannot reach his nails.These nails are painful walking and wearing shoes.  This patient presents for at risk foot care today.  General Appearance  Alert, conversant and in no acute stress.  Vascular  Dorsalis pedis and posterior tibial  pulses are weakly  palpable  bilaterally.  Capillary return is within normal limits  bilaterally. Temperature is within normal limits  bilaterally.  Neurologic  Senn-Weinstein monofilament wire test within normal limits  bilaterally. Muscle power within normal limits bilaterally.  Nails Thick disfigured discolored nails with subungual debris  from hallux to fifth toes bilaterally. No evidence of bacterial infection or drainage bilaterally.  Orthopedic  No limitations of motion  feet .  No crepitus or effusions noted.  No bony pathology or digital deformities noted.  Skin  normotropic skin with no porokeratosis noted bilaterally.  No signs of infections or ulcers noted.  Masceration interdigitally  Both feet.   Onychomycosis  Pain in right toes  Pain in left toes  Consent was obtained for treatment procedures.   Mechanical debridement of nails 1-5  bilaterally performed with a nail nipper.  Filed with dremel without incident.  Patient was told to apply powder between toes both feet.   Return office visit    3 months                  Told patient to return for periodic foot care and evaluation due to potential at risk complications.   [name] DPM

## 2021-06-19 ENCOUNTER — Ambulatory Visit: Payer: Medicare Other | Admitting: Podiatry

## 2021-07-06 ENCOUNTER — Ambulatory Visit: Payer: Medicare Other | Admitting: Podiatry

## 2021-09-25 ENCOUNTER — Encounter: Payer: Self-pay | Admitting: Podiatry

## 2021-09-25 ENCOUNTER — Other Ambulatory Visit: Payer: Self-pay

## 2021-09-25 ENCOUNTER — Ambulatory Visit (INDEPENDENT_AMBULATORY_CARE_PROVIDER_SITE_OTHER): Payer: Medicare Other | Admitting: Podiatry

## 2021-09-25 ENCOUNTER — Encounter (INDEPENDENT_AMBULATORY_CARE_PROVIDER_SITE_OTHER): Payer: Self-pay

## 2021-09-25 DIAGNOSIS — M79676 Pain in unspecified toe(s): Secondary | ICD-10-CM | POA: Diagnosis not present

## 2021-09-25 DIAGNOSIS — B351 Tinea unguium: Secondary | ICD-10-CM | POA: Diagnosis not present

## 2021-09-25 DIAGNOSIS — N189 Chronic kidney disease, unspecified: Secondary | ICD-10-CM

## 2021-09-25 DIAGNOSIS — D689 Coagulation defect, unspecified: Secondary | ICD-10-CM | POA: Diagnosis not present

## 2021-09-25 NOTE — Progress Notes (Signed)
This patient returns to my office for at risk foot care.  This patient requires this care by a professional since this patient will be at risk due to having coagulation defect and chronic kidney disease.  Patient is taking plavix.  This patient is unable to cut nails himself since the patient cannot reach his nails.These nails are painful walking and wearing shoes.  This patient presents for at risk foot care today.  General Appearance  Alert, conversant and in no acute stress.  Vascular  Dorsalis pedis and posterior tibial  pulses are weakly  palpable  bilaterally.  Capillary return is within normal limits  bilaterally. Temperature is within normal limits  bilaterally.  Neurologic  Senn-Weinstein monofilament wire test within normal limits  bilaterally. Muscle power within normal limits bilaterally.  Nails Thick disfigured discolored nails with subungual debris  from hallux to fifth toes bilaterally. No evidence of bacterial infection or drainage bilaterally.  Orthopedic  No limitations of motion  feet .  No crepitus or effusions noted.  HAV with hammer toes second  B/L.  Skin  normotropic skin with no porokeratosis noted bilaterally.  No signs of infections or ulcers noted.  Masceration interdigitally  Both feet.   Onychomycosis  Pain in right toes  Pain in left toes  Consent was obtained for treatment procedures.   Mechanical debridement of nails 1-5  bilaterally performed with a nail nipper.  Filed with dremel without incident.     Return office visit    3 months                  Told patient to return for periodic foot care and evaluation due to potential at risk complications.   [name] DPM

## 2022-01-15 ENCOUNTER — Other Ambulatory Visit: Payer: Self-pay

## 2022-01-15 ENCOUNTER — Ambulatory Visit (INDEPENDENT_AMBULATORY_CARE_PROVIDER_SITE_OTHER): Payer: Medicare Other | Admitting: Podiatry

## 2022-01-15 ENCOUNTER — Encounter: Payer: Self-pay | Admitting: Podiatry

## 2022-01-15 DIAGNOSIS — M79676 Pain in unspecified toe(s): Secondary | ICD-10-CM

## 2022-01-15 DIAGNOSIS — B351 Tinea unguium: Secondary | ICD-10-CM | POA: Diagnosis not present

## 2022-01-15 DIAGNOSIS — N189 Chronic kidney disease, unspecified: Secondary | ICD-10-CM

## 2022-01-15 DIAGNOSIS — D689 Coagulation defect, unspecified: Secondary | ICD-10-CM

## 2022-01-15 DIAGNOSIS — M2010 Hallux valgus (acquired), unspecified foot: Secondary | ICD-10-CM

## 2022-01-15 NOTE — Progress Notes (Signed)
This patient returns to my office for at risk foot care.  This patient requires this care by a professional since this patient will be at risk due to having coagulation defect and chronic kidney disease.  Patient is taking plavix.  This patient is unable to cut nails himself since the patient cannot reach his nails.These nails are painful walking and wearing shoes.  This patient presents for at risk foot care today.  General Appearance  Alert, conversant and in no acute stress.  Vascular  Dorsalis pedis and posterior tibial  pulses are weakly  palpable  bilaterally.  Capillary return is within normal limits  bilaterally. Temperature is within normal limits  bilaterally.  Neurologic  Senn-Weinstein monofilament wire test within normal limits  bilaterally. Muscle power within normal limits bilaterally.  Nails Thick disfigured discolored nails with subungual debris  from hallux to fifth toes bilaterally. No evidence of bacterial infection or drainage bilaterally.  Orthopedic  No limitations of motion  feet .  No crepitus or effusions noted.  HAV with hammer toes second  B/L.  Skin  normotropic skin with no porokeratosis noted bilaterally.  No signs of infections or ulcers noted.  Masceration interdigitally  Both feet.   Onychomycosis  Pain in right toes  Pain in left toes  Consent was obtained for treatment procedures.   Mechanical debridement of nails 1-5  bilaterally performed with a nail nipper.  Filed with dremel without incident.     Return office visit    3 months                  Told patient to return for periodic foot care and evaluation due to potential at risk complications.   Plumer Mittelstaedt DPM  

## 2022-02-05 DIAGNOSIS — Z8673 Personal history of transient ischemic attack (TIA), and cerebral infarction without residual deficits: Secondary | ICD-10-CM | POA: Insufficient documentation

## 2022-02-05 DIAGNOSIS — M79606 Pain in leg, unspecified: Secondary | ICD-10-CM | POA: Insufficient documentation

## 2022-04-09 ENCOUNTER — Encounter: Payer: Self-pay | Admitting: Podiatry

## 2022-04-09 ENCOUNTER — Ambulatory Visit (INDEPENDENT_AMBULATORY_CARE_PROVIDER_SITE_OTHER): Payer: Medicare Other | Admitting: Podiatry

## 2022-04-09 DIAGNOSIS — B351 Tinea unguium: Secondary | ICD-10-CM | POA: Diagnosis not present

## 2022-04-09 DIAGNOSIS — M2010 Hallux valgus (acquired), unspecified foot: Secondary | ICD-10-CM

## 2022-04-09 DIAGNOSIS — D689 Coagulation defect, unspecified: Secondary | ICD-10-CM | POA: Diagnosis not present

## 2022-04-09 DIAGNOSIS — M79676 Pain in unspecified toe(s): Secondary | ICD-10-CM | POA: Diagnosis not present

## 2022-04-09 DIAGNOSIS — N189 Chronic kidney disease, unspecified: Secondary | ICD-10-CM | POA: Diagnosis not present

## 2022-04-09 NOTE — Progress Notes (Signed)
This patient returns to my office for at risk foot care.  This patient requires this care by a professional since this patient will be at risk due to having coagulation defect and chronic kidney disease.  Patient is taking plavix.  This patient is unable to cut nails himself since the patient cannot reach his nails.These nails are painful walking and wearing shoes.  This patient presents for at risk foot care today.  General Appearance  Alert, conversant and in no acute stress.  Vascular  Dorsalis pedis and posterior tibial  pulses are weakly  palpable  bilaterally.  Capillary return is within normal limits  bilaterally. Temperature is within normal limits  bilaterally.  Neurologic  Senn-Weinstein monofilament wire test within normal limits  bilaterally. Muscle power within normal limits bilaterally.  Nails Thick disfigured discolored nails with subungual debris  from hallux to fifth toes bilaterally. No evidence of bacterial infection or drainage bilaterally.  Orthopedic  No limitations of motion  feet .  No crepitus or effusions noted.  HAV with hammer toes second  B/L.  Skin  normotropic skin with no porokeratosis noted bilaterally.  No signs of infections or ulcers noted.  Masceration interdigitally  Both feet.   Onychomycosis  Pain in right toes  Pain in left toes  Consent was obtained for treatment procedures.   Mechanical debridement of nails 1-5  bilaterally performed with a nail nipper.  Filed with dremel without incident.     Return office visit    3 months                  Told patient to return for periodic foot care and evaluation due to potential at risk complications.   [name] DPM

## 2022-07-16 ENCOUNTER — Ambulatory Visit: Payer: Medicare Other | Admitting: Podiatry

## 2022-08-23 ENCOUNTER — Encounter: Payer: Self-pay | Admitting: Podiatry

## 2022-08-23 ENCOUNTER — Ambulatory Visit (INDEPENDENT_AMBULATORY_CARE_PROVIDER_SITE_OTHER): Payer: Medicare Other | Admitting: Podiatry

## 2022-08-23 DIAGNOSIS — D689 Coagulation defect, unspecified: Secondary | ICD-10-CM

## 2022-08-23 DIAGNOSIS — M79676 Pain in unspecified toe(s): Secondary | ICD-10-CM

## 2022-08-23 DIAGNOSIS — N189 Chronic kidney disease, unspecified: Secondary | ICD-10-CM

## 2022-08-23 DIAGNOSIS — M2010 Hallux valgus (acquired), unspecified foot: Secondary | ICD-10-CM

## 2022-08-23 DIAGNOSIS — B351 Tinea unguium: Secondary | ICD-10-CM

## 2022-08-23 NOTE — Progress Notes (Signed)
This patient returns to my office for at risk foot care.  This patient requires this care by a professional since this patient will be at risk due to having coagulation defect and chronic kidney disease.  Patient is taking plavix.  This patient is unable to cut nails himself since the patient cannot reach his nails.These nails are painful walking and wearing shoes.  This patient presents for at risk foot care today.  General Appearance  Alert, conversant and in no acute stress.  Vascular  Dorsalis pedis and posterior tibial  pulses are weakly  palpable  bilaterally.  Capillary return is within normal limits  bilaterally. Temperature is within normal limits  bilaterally.  Neurologic  Senn-Weinstein monofilament wire test within normal limits  bilaterally. Muscle power within normal limits bilaterally.  Nails Thick disfigured discolored nails with subungual debris  from hallux to fifth toes bilaterally. No evidence of bacterial infection or drainage bilaterally.  Orthopedic  No limitations of motion  feet .  No crepitus or effusions noted.  HAV with hammer toes second  B/L.  Skin  normotropic skin with no porokeratosis noted bilaterally.  No signs of infections or ulcers noted.  Masceration interdigitally  Both feet.   Onychomycosis  Pain in right toes  Pain in left toes  Consent was obtained for treatment procedures.   Mechanical debridement of nails 1-5  bilaterally performed with a nail nipper.  Filed with dremel without incident.     Return office visit    10 weeks                   Told patient to return for periodic foot care and evaluation due to potential at risk complications.   Gardiner Barefoot DPM

## 2022-11-29 ENCOUNTER — Encounter: Payer: Self-pay | Admitting: Podiatry

## 2022-11-29 ENCOUNTER — Ambulatory Visit (INDEPENDENT_AMBULATORY_CARE_PROVIDER_SITE_OTHER): Payer: Medicare Other | Admitting: Podiatry

## 2022-11-29 VITALS — BP 144/88 | HR 62

## 2022-11-29 DIAGNOSIS — M2010 Hallux valgus (acquired), unspecified foot: Secondary | ICD-10-CM

## 2022-11-29 DIAGNOSIS — D689 Coagulation defect, unspecified: Secondary | ICD-10-CM

## 2022-11-29 DIAGNOSIS — N189 Chronic kidney disease, unspecified: Secondary | ICD-10-CM

## 2022-11-29 DIAGNOSIS — B351 Tinea unguium: Secondary | ICD-10-CM | POA: Diagnosis not present

## 2022-11-29 DIAGNOSIS — M79676 Pain in unspecified toe(s): Secondary | ICD-10-CM

## 2022-11-29 NOTE — Progress Notes (Addendum)
This patient returns to my office for at risk foot care.  This patient requires this care by a professional since this patient will be at risk due to having coagulation defect and chronic kidney disease.  Patient is taking plavix.  This patient is unable to cut nails himself since the patient cannot reach his nails.These nails are painful walking and wearing shoes.  This patient presents for at risk foot care today.  General Appearance  Alert, conversant and in no acute stress.  Vascular  Dorsalis pedis and posterior tibial  pulses are weakly  palpable  bilaterally.  Capillary return is within normal limits  bilaterally. Temperature is within normal limits  bilaterally.  Neurologic  Senn-Weinstein monofilament wire test within normal limits  bilaterally. Muscle power within normal limits bilaterally.  Nails Thick disfigured discolored nails with subungual debris  from hallux to fifth toes bilaterally. No evidence of bacterial infection or drainage bilaterally.  Orthopedic  No limitations of motion  feet .  No crepitus or effusions noted.  HAV with hammer toes second  B/L.  Skin  normotropic skin with no porokeratosis noted bilaterally.  No signs of infections or ulcers noted.  Masceration interdigitally  Both feet.   Onychomycosis  Pain in right toes  Pain in left toes  Consent was obtained for treatment procedures.   Mechanical debridement of nails 1-5  bilaterally performed with a nail nipper.  Filed with dremel without incident.     Return office visit    4 months                   Told patient to return for periodic foot care and evaluation due to potential at risk complications.   Gardiner Barefoot DPM

## 2023-02-28 ENCOUNTER — Ambulatory Visit: Payer: Medicare Other | Admitting: Podiatry

## 2023-03-28 ENCOUNTER — Ambulatory Visit (INDEPENDENT_AMBULATORY_CARE_PROVIDER_SITE_OTHER): Payer: 59 | Admitting: Podiatry

## 2023-03-28 ENCOUNTER — Encounter: Payer: Self-pay | Admitting: Podiatry

## 2023-03-28 DIAGNOSIS — B353 Tinea pedis: Secondary | ICD-10-CM | POA: Diagnosis not present

## 2023-03-28 DIAGNOSIS — D689 Coagulation defect, unspecified: Secondary | ICD-10-CM

## 2023-03-28 DIAGNOSIS — B351 Tinea unguium: Secondary | ICD-10-CM

## 2023-03-28 DIAGNOSIS — M79676 Pain in unspecified toe(s): Secondary | ICD-10-CM | POA: Diagnosis not present

## 2023-03-28 MED ORDER — MICONAZOLE NITRATE 2 % EX AERO
INHALATION_SPRAY | CUTANEOUS | 3 refills | Status: AC
Start: 2023-03-28 — End: ?

## 2023-04-02 NOTE — Progress Notes (Signed)
  Subjective:  Patient ID: Spencer Gibson, male    DOB: May 26, 1934,  MRN: [id number]  Spencer Gibson presents to clinic today for at risk foot care with h/o coagulation defect:  Chief Complaint  Patient presents with   Nail Problem    RFC-PCPs   Tiana Loft, MD     .  PCP is Tiana Loft, MD.  Allergies  Allergen Reactions   Penicillins Hives   Enalapril     Chronic dry cough    Review of Systems: Negative except as noted in the HPI.  Objective: No changes noted in today's physical examination. There were no vitals filed for this visit.  Spencer Gibson is a pleasant 89 y.o. male in NAD. AAO x 3.  Vascular Examination: Capillary refill time <3 seconds b/l LE. Palpable pedal pulses b/l LE. Digital hair present b/l. No pedal edema b/l. Skin temperature gradient WNL b/l. No varicosities b/l. No cyanosis or clubbing noted b/l LE.Marland Kitchen  Dermatological Examination: Pedal skin with normal turgor, texture and tone b/l. No open wounds. Toenails 1-5 b/l thickened, discolored, dystrophic with subungual debris. There is pain on palpation to dorsal aspect of nailplates. Interdigital maceration noted 1st-4th webspace(s). No blistering, no weeping, no open wounds. No hyperkeratotic nor porokeratotic lesions present on today's visit.Marland Kitchen  Neurological Examination: Protective sensation intact with 10 gram monofilament b/l LE. Vibratory sensation intact b/l LE.   Musculoskeletal Examination: Muscle strength 5/5 to all LE muscle groups b/l. HAV with bunion deformity noted b/l LE. Hammertoe(s) noted to the bilateral 2nd toes.  Assessment/Plan: 1. Pain due to onychomycosis of toenail   2. Tinea pedis of both feet   3. Coagulation disorder (HCC)     Meds ordered this encounter  Medications   Miconazole Nitrate 2 % AERO    Sig: Spray between toes once daily.    Dispense:  150 g    Refill:  3   Patient was evaluated and treated. All patient's and/or POA's  questions/concerns addressed on today's visit. Toenails 1-5 debrided in length and girth without incident. Continue soft, supportive shoe gear daily. Report any pedal injuries to medical professional. -Patient instructed to spray shoes with Lysol every evening. -Rx written for Miconazole Spray Powder 2% antifungal spray powder. Spray between toes once daily. Dry well between toes and under toes after bath/shower. -Patient/POA to call should there be question/concern in the interim.   Return in about 3 months (around 06/28/2023).  Freddie Breech, DPM

## 2023-07-01 ENCOUNTER — Ambulatory Visit: Payer: 59 | Admitting: Podiatry

## 2023-07-08 ENCOUNTER — Encounter: Payer: Self-pay | Admitting: Podiatry

## 2023-07-08 ENCOUNTER — Ambulatory Visit (INDEPENDENT_AMBULATORY_CARE_PROVIDER_SITE_OTHER): Payer: 59 | Admitting: Podiatry

## 2023-07-08 DIAGNOSIS — D689 Coagulation defect, unspecified: Secondary | ICD-10-CM

## 2023-07-08 DIAGNOSIS — B353 Tinea pedis: Secondary | ICD-10-CM | POA: Diagnosis not present

## 2023-07-08 DIAGNOSIS — M79676 Pain in unspecified toe(s): Secondary | ICD-10-CM

## 2023-07-08 DIAGNOSIS — B351 Tinea unguium: Secondary | ICD-10-CM

## 2023-07-08 MED ORDER — KETOCONAZOLE 2 % EX CREA
TOPICAL_CREAM | CUTANEOUS | 1 refills | Status: AC
Start: 2023-07-08 — End: ?

## 2023-07-14 NOTE — Progress Notes (Signed)
  Subjective:  Patient ID: Spencer Gibson, male    DOB: Oct 03, 1934,  MRN: [id number]  89 y.o. male presents to clinic with  at risk foot care with h/o coagulation defect and painful elongated mycotic toenails 1-5 bilaterally which are tender when wearing enclosed shoe gear. Pain is relieved with periodic professional debridement.  Chief Complaint  Patient presents with   Nail Problem    RFC,Referring Provider Tiana Loft, MD,lov:      New problem(s): None   PCP is Tiana Loft, MD.  Allergies  Allergen Reactions   Penicillins Hives   Enalapril     Chronic dry cough   Review of Systems: Negative except as noted in the HPI.   Objective:  Spencer Gibson is a pleasant 89 y.o. male in NAD.Marland Kitchen  Vascular Examination: Vascular status intact b/l with palpable pedal pulses. CFT <3 seconds b/l. No edema. No pain with calf compression b/l. Skin temperature gradient WNL b/l. No cyanosis or clubbing noted b/l LE.  Neurological Examination: Sensation grossly intact b/l with 10 gram monofilament. Vibratory sensation intact b/l.   Dermatological Examination: Pedal skin with normal turgor, texture and tone b/l. Toenails 1-5 b/l thick, discolored, elongated with subungual debris and pain on dorsal palpation. No hyperkeratotic lesions noted b/l. Diffuse scaling noted peripherally and plantarly b/l feet.  No interdigital macerations.  No blisters, no weeping. No signs of secondary bacterial infection noted.  Musculoskeletal Examination: Muscle strength 5/5 to b/l LE. HAV with bunion deformity noted b/l LE. Hammertoe(s) noted to the L 2nd toe and R 2nd toe.  Radiographs: None Assessment:   1. Pain due to onychomycosis of toenail   2. Tinea pedis of both feet   3. Coagulation disorder (HCC)    Plan:   Meds ordered this encounter  Medications   ketoconazole (NIZORAL) 2 % cream    Sig: Apply to both feet and between toes once daily for 6 weeks.    Dispense:  60 g     Refill:  1   -Consent given for treatment as described below: -Examined patient. -Continue supportive shoe gear daily. -Mycotic toenails 1-5 bilaterally were debrided in length and girth with sterile nail nippers and dremel without incident. -For tinea pedis, Rx sent to pharmacy for Ketoconazole Cream 2% to be applied once daily for six weeks. -Patient/POA to call should there be question/concern in the interim.  Return in about 3 months (around 10/08/2023).  Freddie Breech, DPM

## 2023-08-09 DIAGNOSIS — N320 Bladder-neck obstruction: Secondary | ICD-10-CM | POA: Insufficient documentation

## 2023-08-23 ENCOUNTER — Other Ambulatory Visit: Payer: Self-pay | Admitting: Podiatry

## 2023-08-23 DIAGNOSIS — B353 Tinea pedis: Secondary | ICD-10-CM

## 2023-10-07 ENCOUNTER — Ambulatory Visit (INDEPENDENT_AMBULATORY_CARE_PROVIDER_SITE_OTHER): Payer: 59 | Admitting: Podiatry

## 2023-10-07 ENCOUNTER — Encounter: Payer: Self-pay | Admitting: Podiatry

## 2023-10-07 DIAGNOSIS — B351 Tinea unguium: Secondary | ICD-10-CM

## 2023-10-07 DIAGNOSIS — D689 Coagulation defect, unspecified: Secondary | ICD-10-CM

## 2023-10-07 DIAGNOSIS — L84 Corns and callosities: Secondary | ICD-10-CM

## 2023-10-07 DIAGNOSIS — M79676 Pain in unspecified toe(s): Secondary | ICD-10-CM

## 2023-10-13 NOTE — Progress Notes (Signed)
  Subjective:  Patient ID: Spencer Gibson, male    DOB: 02-Oct-1934,  MRN: [id number]  Shan Levans presents to clinic today for at risk foot care with h/o coagulation defect and corn(s) right foot, callus(es) right foot and painful mycotic nails.  Pain interferes with ambulation. Aggravating factors include wearing enclosed shoe gear. Painful toenails interfere with ambulation. Aggravating factors include wearing enclosed shoe gear. Pain is relieved with periodic professional debridement. Painful corns and calluses are aggravated when weightbearing with and without shoegear. Pain is relieved with periodic professional debridement.  Chief Complaint  Patient presents with   Nail Problem    "Toenails"   New problem(s): None.   PCP is Tiana Loft, MD.  Allergies  Allergen Reactions   Penicillins Hives   Enalapril     Chronic dry cough    Review of Systems: Negative except as noted in the HPI.  Objective:  There were no vitals filed for this visit. EION KOLIN is a pleasant 89 y.o. male in NAD. AAO x 3.  Vascular Examination: Vascular status intact b/l with palpable pedal pulses. CFT <3 seconds b/l. No edema. No pain with calf compression b/l. Skin temperature gradient WNL b/l. No cyanosis or clubbing noted b/l LE.  Neurological Examination: Sensation grossly intact b/l with 10 gram monofilament. Vibratory sensation intact b/l.   Dermatological Examination: Pedal skin with normal turgor, texture and tone b/l. Toenails 1-5 b/l thick, discolored, elongated with subungual debris and pain on dorsal palpation. Hyperkeratotic lesion(s) distal tip of right 2nd toe and submet head 1 right foot.  No erythema, no edema, no drainage, no fluctuance..   Musculoskeletal Examination: Muscle strength 5/5 to b/l LE. HAV with bunion deformity noted b/l LE. Hammertoe(s) noted to the L 2nd toe and R 2nd toe.  Radiographs: None  Assessment/Plan: 1. Pain due to onychomycosis  of toenail   2. Corns and callosities   3. Coagulation disorder (HCC)     -Patient was evaluated today. All questions/concerns addressed on today's visit. -Patient to continue soft, supportive shoe gear daily. -Toenails 1-5 b/l were debrided in length and girth with sterile nail nippers and dremel without iatrogenic bleeding.  -Corn(s) distal tip of right 2nd toe and callus(es) submet head 1 right foot were pared utilizing sterile scalpel blade without incident. Total number debrided =2. -Patient/POA to call should there be question/concern in the interim.   Return in about 3 months (around 01/07/2024).  Freddie Breech, DPM      [place] LOCATION: [address], [place] [address]                   Office [phone]   [facility] LOCATION: [address] [place], [place] [address] Office [phone]

## 2024-01-13 ENCOUNTER — Ambulatory Visit: Payer: 59 | Admitting: Podiatry

## 2024-02-10 ENCOUNTER — Ambulatory Visit (INDEPENDENT_AMBULATORY_CARE_PROVIDER_SITE_OTHER): Payer: 59 | Admitting: Podiatry

## 2024-02-10 ENCOUNTER — Encounter: Payer: Self-pay | Admitting: Podiatry

## 2024-02-10 DIAGNOSIS — L84 Corns and callosities: Secondary | ICD-10-CM | POA: Diagnosis not present

## 2024-02-10 DIAGNOSIS — B351 Tinea unguium: Secondary | ICD-10-CM | POA: Diagnosis not present

## 2024-02-10 DIAGNOSIS — M79676 Pain in unspecified toe(s): Secondary | ICD-10-CM | POA: Diagnosis not present

## 2024-02-10 DIAGNOSIS — D689 Coagulation defect, unspecified: Secondary | ICD-10-CM

## 2024-02-10 NOTE — Progress Notes (Signed)
  Subjective:  Patient ID: KASEN ADDUCI, male    DOB: 04/16/1934,  MRN: [id number]  Shan Levans presents to clinic today for at risk foot care with h/o coagulation defect and corn(s) right lower extremity and painful mycotic toenails that are difficult to trim. Painful toenails interfere with ambulation. Aggravating factors include wearing enclosed shoe gear. Pain is relieved with periodic professional debridement. Painful corns are aggravated when weightbearing when wearing enclosed shoe gear. Pain is relieved with periodic professional debridement.  Chief Complaint  Patient presents with   Nail Problem    Pt is here for [facility] PCP is Dr Baldwin Crown and LOV was sometime last year.   New problem(s): None.   PCP is Tiana Loft, MD.  Allergies  Allergen Reactions   Penicillins Hives   Enalapril     Chronic dry cough    Review of Systems: Negative except as noted in the HPI.  Objective: No changes noted in today's physical examination. There were no vitals filed for this visit. TOMIE ELKO is a pleasant 89 y.o. male WD, WN in NAD. AAO x 3.  Vascular Examination: Vascular status intact b/l with palpable pedal pulses. CFT <3 seconds b/l. No edema. No pain with calf compression b/l. Skin temperature gradient WNL b/l. No cyanosis or clubbing noted b/l LE.  Neurological Examination: Sensation grossly intact b/l with 10 gram monofilament. Vibratory sensation intact b/l.   Dermatological Examination: Pedal skin with normal turgor, texture and tone b/l. Toenails 1-5 b/l thick, discolored, elongated with subungual debris and pain on dorsal palpation. Hyperkeratotic lesion(s) distal tip of right 3rd toe and 1st metatarsal head right foot.  No erythema, no edema, no drainage, no fluctuance.  Musculoskeletal Examination: Muscle strength 5/5 to b/l LE. HAV with bunion deformity noted b/l LE. Hammertoe(s) noted to the L 2nd toe and R 2nd toe.  Radiographs:  None  Assessment/Plan: 1. Pain due to onychomycosis of toenail   2. Corns and callosities   3. Coagulation disorder (HCC)     -Consent given for treatment as described below: -Examined patient. -Continue supportive shoe gear daily. -Mycotic toenails 1-5 bilaterally were debrided in length and girth with sterile nail nippers and dremel without incident. -Corn(s) R 3rd toe and callus(es) 1st metatarsal head right foot were pared utilizing sterile scalpel blade without incident. Total number debrided =2. -Patient/POA to call should there be question/concern in the interim.   Return in about 3 months (around 05/12/2024).  Freddie Breech, DPM      [place] LOCATION: [address] [place] [address]                   Office [phone]   [facility] LOCATION: [address] [place], [place] [address] Office [phone]

## 2024-02-19 DIAGNOSIS — F01A0 Vascular dementia, mild, without behavioral disturbance, psychotic disturbance, mood disturbance, and anxiety: Secondary | ICD-10-CM | POA: Insufficient documentation

## 2024-05-14 ENCOUNTER — Encounter: Payer: Self-pay | Admitting: Podiatry

## 2024-05-14 ENCOUNTER — Ambulatory Visit (INDEPENDENT_AMBULATORY_CARE_PROVIDER_SITE_OTHER): Admitting: Podiatry

## 2024-05-14 DIAGNOSIS — L84 Corns and callosities: Secondary | ICD-10-CM | POA: Diagnosis not present

## 2024-05-14 DIAGNOSIS — B351 Tinea unguium: Secondary | ICD-10-CM | POA: Diagnosis not present

## 2024-05-14 DIAGNOSIS — D689 Coagulation defect, unspecified: Secondary | ICD-10-CM

## 2024-05-14 DIAGNOSIS — M79676 Pain in unspecified toe(s): Secondary | ICD-10-CM

## 2024-05-17 NOTE — Progress Notes (Signed)
  Subjective:  Patient ID: Spencer Gibson, male    DOB: 06-11-1934,  MRN: [id number]  Spencer Gibson presents to clinic today for at risk foot care with h/o coagulation defect and callus(es) right foot and painful mycotic toenails that are difficult to trim. Painful toenails interfere with ambulation. Aggravating factors include wearing enclosed shoe gear. Pain is relieved with periodic professional debridement. Painful calluses are aggravated when weightbearing with and without shoegear. Pain is relieved with periodic professional debridement.  Chief Complaint  Patient presents with   Nail Problem    Thick painful toenails, 3 month follow up   New problem(s): None.   PCP is Cabellon, Melissa, MD.  Spencer Gibson 02/19/2024.  Allergies  Allergen Reactions   Penicillins Hives   Enalapril     Chronic dry cough    Review of Systems: Negative except as noted in the HPI.  Objective: No changes noted in today's physical examination. There were no vitals filed for this visit. Spencer Gibson is a pleasant 89 y.o. male WD, WN in NAD. AAO x 3.  Vascular Examination: Capillary refill time immediate b/l. Palpable pedal pulses. No pain with calf compression b/l. Skin temperature gradient WNL b/l. No cyanosis or clubbing b/l. No ischemia or gangrene noted b/l. Pedal hair absent.  Neurological Examination: Sensation grossly intact b/l with 10 gram monofilament. Vibratory sensation intact b/l.   Dermatological Examination: Pedal skin with normal turgor, texture and tone b/l.  No open wounds. No interdigital macerations.   Toenails 1-5 b/l thick, discolored, elongated with subungual debris and pain on dorsal palpation.   Hyperkeratotic lesion(s) 1st metatarsal head right lower extremity.  No erythema, no edema, no drainage, no fluctuance.  Musculoskeletal Examination: Muscle strength 5/5 to all lower extremity muscle groups bilaterally. HAV with bunion deformity noted b/l LE. Hammertoe(s)  bilateral 2nd toes.  Radiographs: None  Assessment/Plan: 1. Pain due to onychomycosis of toenail   2. Callus   3. Coagulation disorder [facility])   Patient was evaluated and treated. All patient's and/or POA's questions/concerns addressed on today's visit. Toenails 1-5 debrided in length and girth without incident. Callus(es) 1st metatarsal head right lower extremity pared with sharp debridement without incident. Continue daily foot inspections and monitor blood glucose per PCP/Endocrinologist's recommendations. Continue soft, supportive shoe gear daily. Report any pedal injuries to medical professional. Call office if there are any questions/concerns. Return in about 3 months (around 08/14/2024).  Spencer Gibson, DPM       LOCATION: [address], [place] [address]                   Office [phone]   [facility] LOCATION: [address] [place], [place] [address] Office [phone]

## 2024-08-20 ENCOUNTER — Ambulatory Visit: Admitting: Podiatry

## 2024-08-20 DIAGNOSIS — B351 Tinea unguium: Secondary | ICD-10-CM

## 2024-08-20 DIAGNOSIS — B353 Tinea pedis: Secondary | ICD-10-CM

## 2024-08-20 DIAGNOSIS — L84 Corns and callosities: Secondary | ICD-10-CM | POA: Diagnosis not present

## 2024-08-20 DIAGNOSIS — D689 Coagulation defect, unspecified: Secondary | ICD-10-CM | POA: Diagnosis not present

## 2024-08-20 DIAGNOSIS — M79676 Pain in unspecified toe(s): Secondary | ICD-10-CM

## 2024-08-20 NOTE — Progress Notes (Signed)
  Subjective:  Patient ID: Spencer Gibson, male    DOB: [birth year],  MRN: [id number]  Freeman JONELLE Edin presents to clinic today for at risk foot care with h/o coagulation defect and callus(es) left foot and painful mycotic toenails that are difficult to trim. Painful toenails interfere with ambulation. Aggravating factors include wearing enclosed shoe gear. Pain is relieved with periodic professional debridement. Painful calluses are aggravated when weightbearing with and without shoegear. Pain is relieved with periodic professional debridement. He is brought to his appointment by his cousin, Albino Degree, on today's visit. Chief Complaint  Patient presents with   Toe Pain    [facility]-PCP is Dr. Emmie Damjanac according to chart information. He was not totally sure.   New problem(s): None per patient.   PCP is Marston Setter, MD.  Allergies  Allergen Reactions   Penicillins Hives   Enalapril     Chronic dry cough    Review of Systems: Negative except as noted in the HPI.  Objective: There were no vitals filed for this visit. ARSALAN BRISBIN is a pleasant 89 y.o. male obese in NAD. AAO x 3.  Vascular Examination: Capillary refill time immediate b/l. Palpable pedal pulses. No pain with calf compression b/l. Skin temperature gradient WNL b/l. No cyanosis or clubbing b/l. No ischemia or gangrene noted b/l. Pedal hair absent.  Neurological Examination: Sensation grossly intact b/l with 10 gram monofilament. Vibratory sensation intact b/l.   Dermatological Examination: Skin warm and supple b/l lower extremities. Interdigital macerations webspaces 1-4 b/l Cracked skin at crease of left 5th digit with fresh bleeding No erythema, no edema, no purulence, no ascending cellultits.  Toenails 1-5 b/l thick, discolored, elongated with subungual debris and pain on dorsal palpation.   Hyperkeratotic lesion(s) 1st metatarsal head right lower extremity.  No erythema, no edema, no  drainage, no fluctuance.  Musculoskeletal Examination: Muscle strength 5/5 to all lower extremity muscle groups bilaterally. HAV with bunion deformity noted b/l LE. Hammertoe(s) bilateral 2nd toes.  Radiographs: None  Assessment/Plan: 1. Pain due to onychomycosis of toenail   2. Tinea pedis of both feet   3. Callus   4. Coagulation disorder   -Consent given for treatment as described below: -Examined patient. -Left 5th digit cleansed with antibacterial soap. Triple antibiotic ointment and band-aid applied.  -Patient to continue soft, supportive shoe gear daily. -Toenails 1-5 b/l were debrided in length and girth with sterile nail nippers and dremel without iatrogenic bleeding.  -Printed information AVS given. To prevent re-infection of tinea pedis, patient/POA/caregiver instructed to spray shoes with Lysol every evening and clean tub/shower with bleach based cleanser. -Patient/POA instructed to purchase OTC Lotrimin antifungal spray powder between toes once daily. -Patient/POA to call should there be question/concern in the interim.   Return in about 3 months (around 11/20/2024).  Delon LITTIE Merlin, DPM      [place] LOCATION: [address] [place] [address]                   Office [phone]   [facility] LOCATION: [address] [place], [place] [address] Office [phone]

## 2024-08-20 NOTE — Patient Instructions (Signed)
 Spray Lotrimin Antifungal Spray powder between toes once daily.   To prevent reinfection, spray shoes with lysol every evening.  Clean tub or shower with bleach based cleanser.  Athlete's Foot Athlete's foot (tinea pedis) is a fungal infection of the skin on your feet. It often occurs on the skin that is between or underneath the toes. It can also occur on the soles of your feet. The infection can spread from person to person (is contagious). It can also spread when a person's bare feet come in contact with the fungus on shower floors or on items such as shoes. What are the causes? This condition is caused by a fungus that grows in warm, moist places. You can get athlete's foot by sharing shoes, shower stalls, towels, and wet floors with someone who is infected. Not washing your feet or changing your socks often enough can also lead to athlete's foot. What increases the risk? This condition is more likely to develop in: Men. People who have a weak body defense system (immune system). People who have diabetes. People who use public showers, such as at a gym. People who wear heavy-duty shoes, such as Youth worker. Seasons with warm, humid weather. What are the signs or symptoms? Symptoms of this condition include: Itchy areas between your toes or on the soles of your feet. White, flaky, or scaly areas between your toes or on the soles of your feet. Very itchy small blisters between your toes or on the soles of your feet. Small cuts in your skin. These cuts can become infected. Thick or discolored toenails. How is this diagnosed? This condition may be diagnosed with a physical exam and a review of your medical history. Your health care provider may also take a skin or toenail sample to examine under a microscope. How is this treated? This condition is treated with antifungal medicines. These may be applied as powders, ointments, or creams. In severe cases, an oral  antifungal medicine may be given. Follow these instructions at home: Medicines Apply or take over-the-counter and prescription medicines only as told by your health care provider. Apply your antifungal medicine as told by your health care provider. Do not stop using the antifungal even if your condition improves. Foot care Do not scratch your feet. Keep your feet dry: Wear cotton or wool socks. Change your socks every day or if they become wet. Wear shoes that allow air to flow, such as sandals or canvas tennis shoes. Wash and dry your feet, including the area between your toes. Also, wash and dry your feet: Every day or as told by your health care provider. After exercising. General instructions Do not let others use towels, shoes, nail clippers, or other personal items that touch your feet. Protect your feet by wearing sandals in wet areas, such as locker rooms and shared showers. Keep all follow-up visits. This is important. If you have diabetes, keep your blood sugar under control. Contact a health care provider if: You have a fever. You have swelling, soreness, warmth, or redness in your foot. Your feet are not getting better with treatment. Your symptoms get worse. You have new symptoms. You have severe pain. Summary Athlete's foot (tinea pedis) is a fungal infection of the skin on your feet. It often occurs on skin that is between or underneath the toes. This condition is caused by a fungus that grows in warm, moist places. Symptoms include white, flaky, or scaly areas between your toes or on the  soles of your feet. This condition is treated with antifungal medicines. Keep your feet clean. Always dry them thoroughly. This information is not intended to replace advice given to you by your health care provider. Make sure you discuss any questions you have with your health care provider. Document Revised: 02/26/2021 Document Reviewed: 02/26/2021 Elsevier Patient Education  2024  [employer].

## 2024-08-23 ENCOUNTER — Encounter: Payer: Self-pay | Admitting: Podiatry

## 2024-11-23 ENCOUNTER — Ambulatory Visit: Admitting: Podiatry

## 2024-11-23 DIAGNOSIS — B351 Tinea unguium: Secondary | ICD-10-CM | POA: Diagnosis not present

## 2024-11-23 DIAGNOSIS — D689 Coagulation defect, unspecified: Secondary | ICD-10-CM | POA: Diagnosis not present

## 2024-11-23 DIAGNOSIS — L84 Corns and callosities: Secondary | ICD-10-CM

## 2024-11-23 DIAGNOSIS — M79676 Pain in unspecified toe(s): Secondary | ICD-10-CM

## 2024-11-29 ENCOUNTER — Encounter: Payer: Self-pay | Admitting: Podiatry

## 2024-11-29 NOTE — Progress Notes (Signed)
"  °  Subjective:  Patient ID: Spencer Gibson, male    DOB: 1934-07-25,  MRN: [id number]  Spencer Gibson presents to clinic today for at risk foot care with h/o coagulation defect and callus(es) right lower extremity and painful mycotic toenails that are difficult to trim. Painful toenails interfere with ambulation. Aggravating factors include wearing enclosed shoe gear. Pain is relieved with periodic professional debridement. Painful calluses are aggravated when weightbearing with and without shoegear. Pain is relieved with periodic professional debridement.  Chief Complaint  Patient presents with   Nail Problem    Thick painful toenails, 3 month follow up Medicaid ABN signed today   New problem(s): None.   PCP is Wilhelm Pump, MD. Spencer Gibson 09/03/24.  Allergies[1]  Review of Systems: Negative except as noted in the HPI.  Objective: No changes noted in today's physical examination. There were no vitals filed for this visit. Spencer Gibson is a pleasant 89 y.o. male in NAD. AAO x 3.  Vascular Examination: Capillary refill time immediate b/l. Palpable pedal pulses. No pain with calf compression b/l. Skin temperature gradient WNL b/l. No cyanosis or clubbing b/l. No ischemia or gangrene noted b/l. Pedal hair absent.  Neurological Examination: Sensation grossly intact b/l with 10 gram monofilament. Vibratory sensation intact b/l.   Dermatological Examination: Skin warm and supple b/l lower extremities. Interdigital macerations webspaces 1-4 b/l Cracked skin at crease of left 5th digit with fresh bleeding No erythema, no edema, no purulence, no ascending cellultits.  Toenails 1-5 b/l thick, discolored, elongated with subungual debris and pain on dorsal palpation.   Hyperkeratotic lesion(s) 1st metatarsal head right lower extremity.  No erythema, no edema, no drainage, no fluctuance.  Musculoskeletal Examination: Muscle strength 5/5 to all lower extremity muscle groups  bilaterally. HAV with bunion deformity noted b/l LE. Hammertoe(s) bilateral 2nd toes.  Radiographs: None  Assessment/Plan: 1. Pain due to onychomycosis of toenail   2. Callus   3. Coagulation disorder   Patient was evaluated and treated. All patient's and/or POA's questions/concerns addressed on today's visit. Mycotic toenails 1-5 b/l debrided in length and girth without incident. Callus(es) 1st metatarsal head right foot pared with sharp debridement without incident. Continue soft, supportive shoe gear daily. Report any pedal injuries to medical professional. Call office if there are any questions/concerns.  Return in about 3 months (around 02/21/2025).  Spencer Gibson, DPM      [place] LOCATION: [address] [place] [address]                   Office [phone]   [place] LOCATION: [address] [place], [place] [address] Office [phone]     [1]  Allergies Allergen Reactions   Penicillins Hives   Enalapril     Chronic dry cough   "

## 2025-03-25 ENCOUNTER — Ambulatory Visit: Admitting: Podiatry
